# Patient Record
Sex: Female | Born: 1970 | Race: White | Hispanic: No | Marital: Married | Smoking: Never smoker
Health system: Southern US, Community
[De-identification: ages and names within clinical notes are randomized; demographics above are authoritative.]

## PROBLEM LIST (undated history)

## (undated) HISTORY — PX: TONSILLECTOMY: SUR1361

## (undated) HISTORY — PX: OTHER SURGICAL HISTORY: SHX169

---

## 1999-02-10 ENCOUNTER — Ambulatory Visit (HOSPITAL_COMMUNITY): Admission: RE | Admit: 1999-02-10 | Discharge: 1999-02-10 | Payer: Self-pay | Admitting: Urology

## 1999-02-10 ENCOUNTER — Encounter: Payer: Self-pay | Admitting: Urology

## 2004-07-27 ENCOUNTER — Ambulatory Visit (HOSPITAL_COMMUNITY): Admission: RE | Admit: 2004-07-27 | Discharge: 2004-07-27 | Payer: Self-pay | Admitting: Chiropractic Medicine

## 2006-04-18 IMAGING — CR DG CERVICAL SPINE COMPLETE 4+V
5 series · 5 of 5 positions shown · non-contrast
Comparison: None.

CLINICAL DATA: Neck and shoulder pain.  History of remote injury with more recent worsening pain. 
 CERVICAL SPINE ? FIVE VIEW:

[view not recorded (1 of 5)]
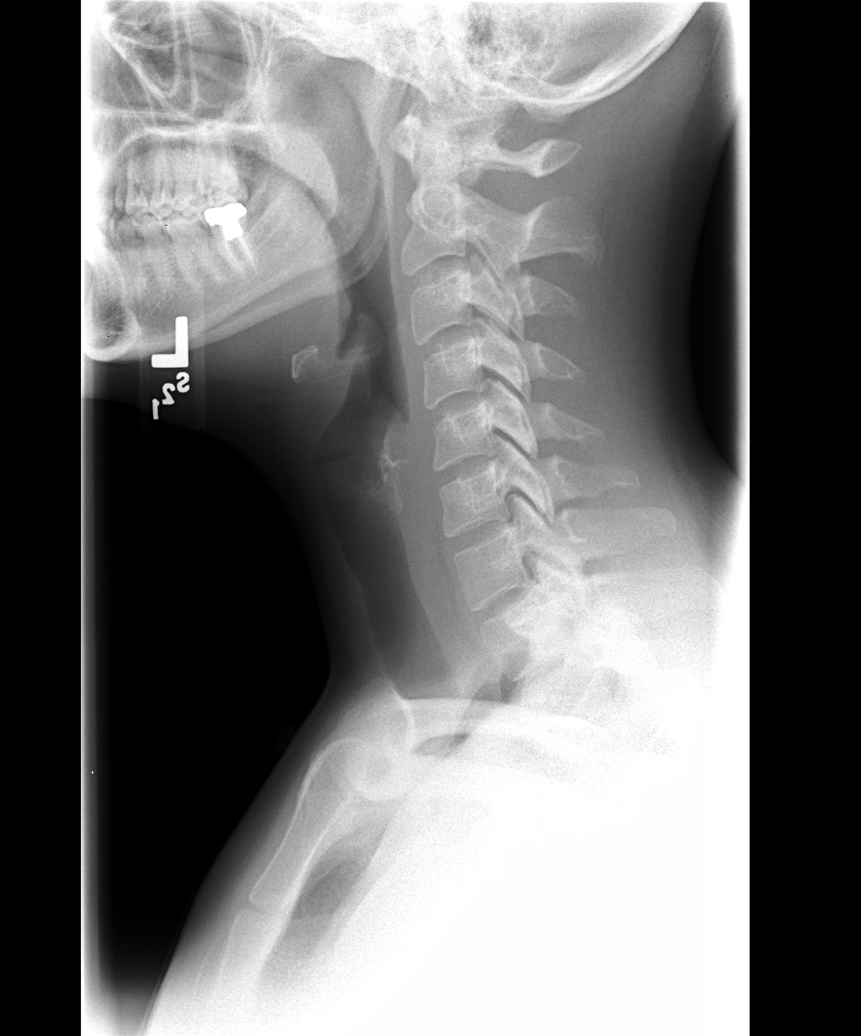

[view not recorded (2 of 5)]
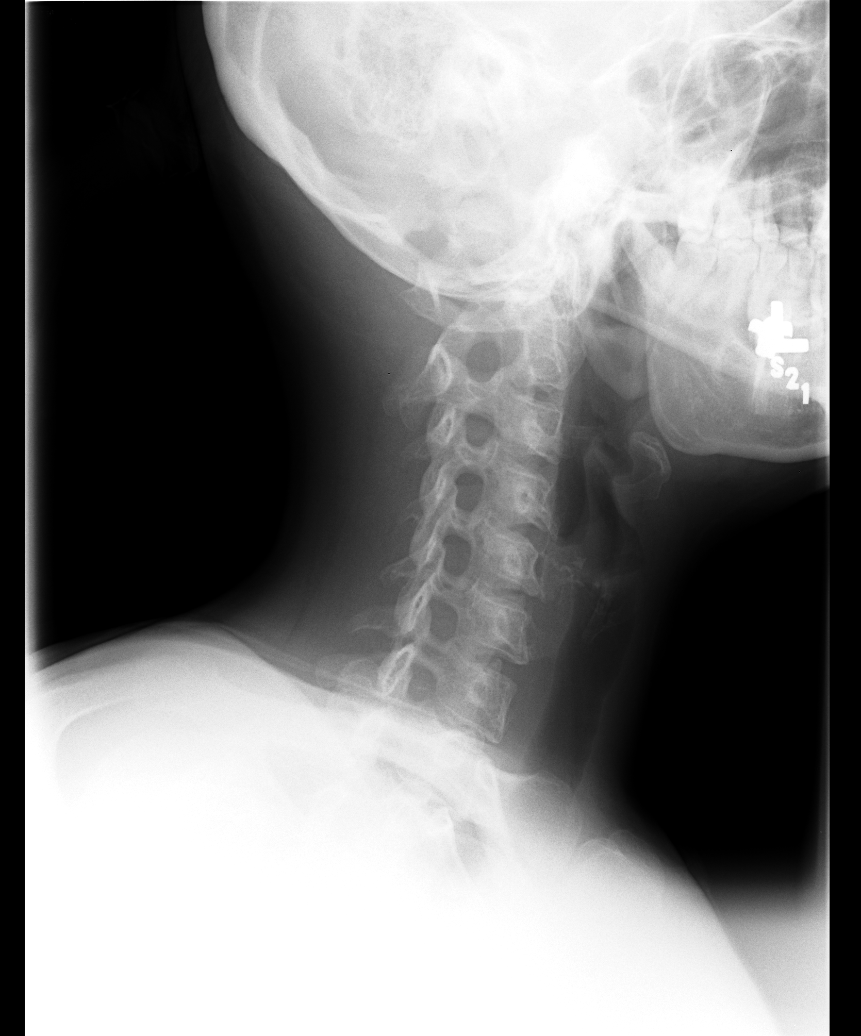

[view not recorded (3 of 5)]
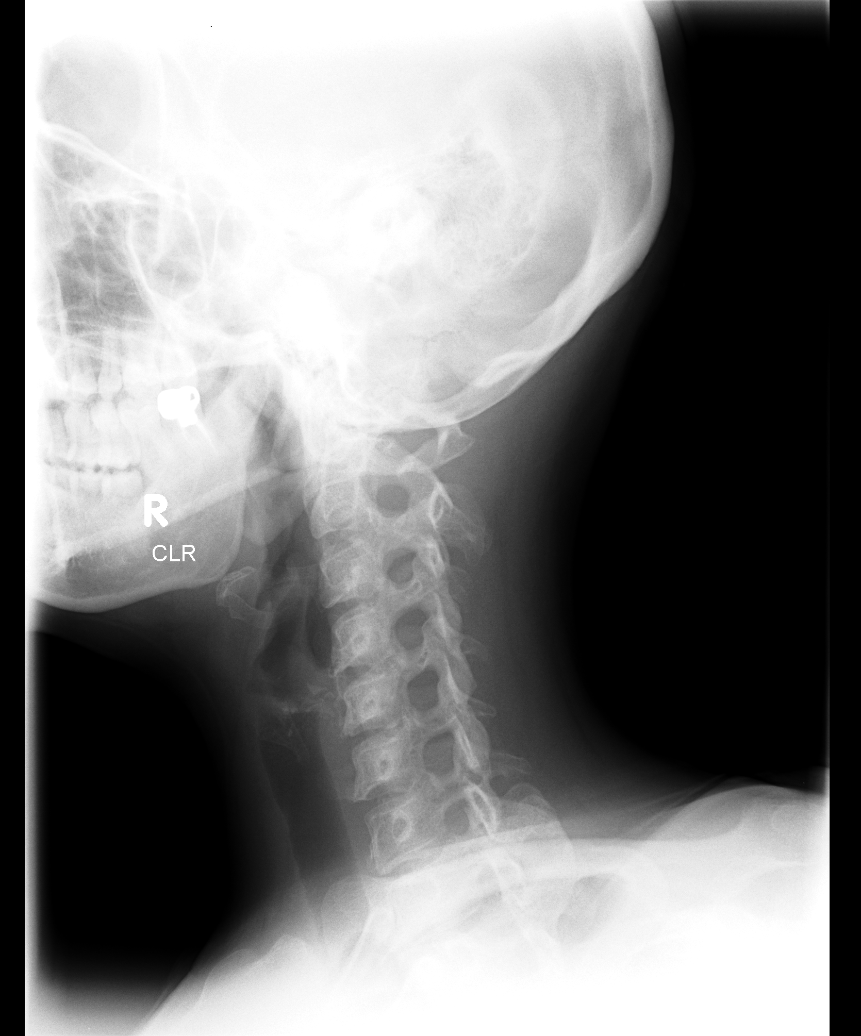

[view not recorded (4 of 5)]
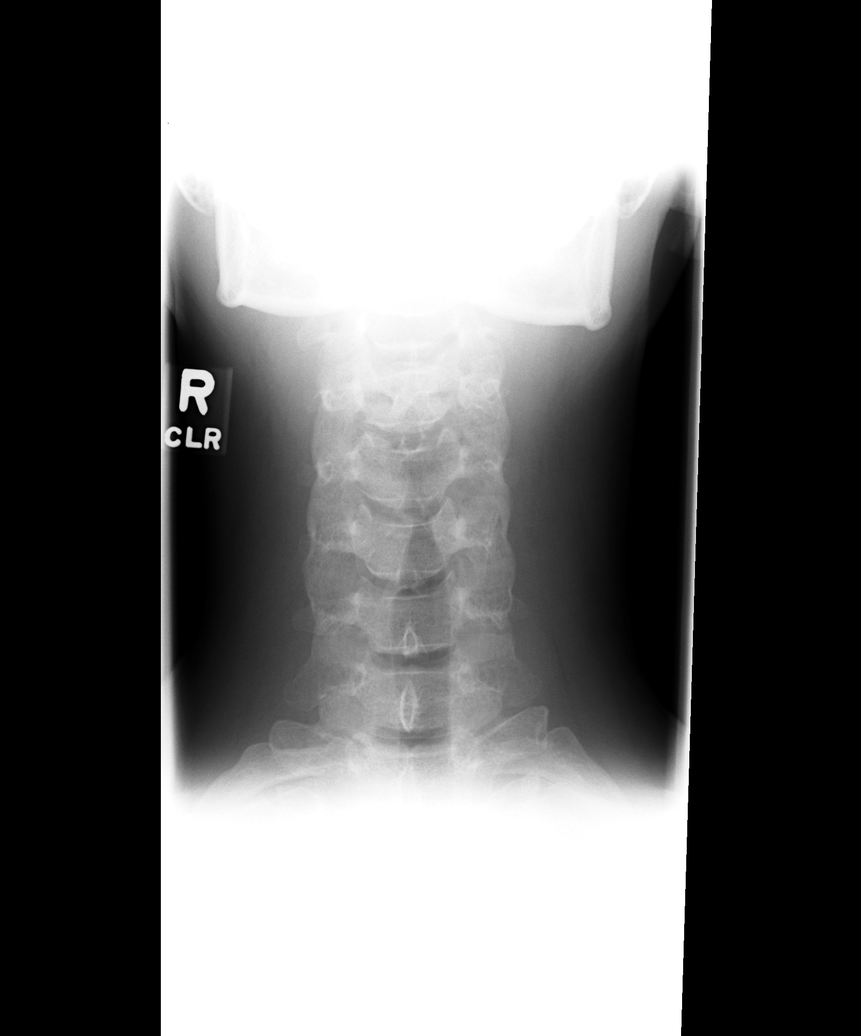

[view not recorded (5 of 5)]
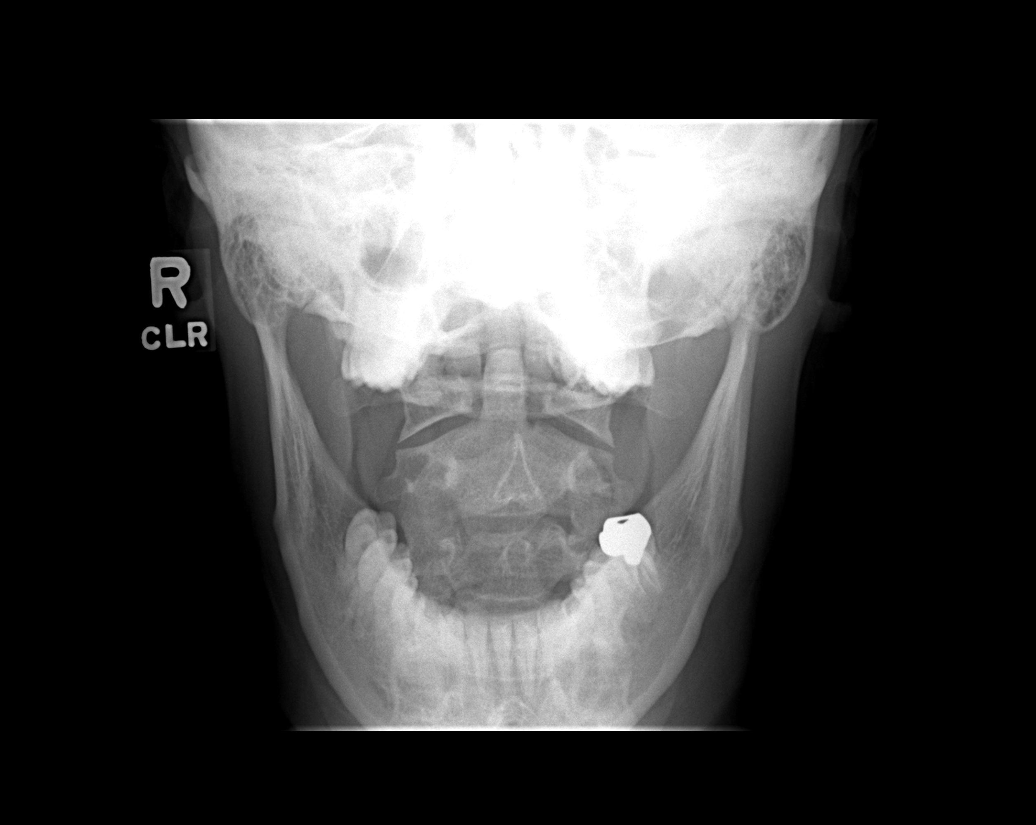

[5 of 5 positions shown; findings below may reference images not displayed]

FINDINGS: Five view exam of the cervical spine shows no acute fracture or subluxation.  There is straightening of the normal cervical lordosis. Intervertebral disc spaces are preserved.  The prevertebral soft tissues are unremarkable.
IMPRESSION: Mild straightening of the normal cervical lordosis without evidence for acute bony abnormality.

## 2014-08-15 ENCOUNTER — Emergency Department (INDEPENDENT_AMBULATORY_CARE_PROVIDER_SITE_OTHER)
Admission: EM | Admit: 2014-08-15 | Discharge: 2014-08-15 | Disposition: A | Discharge status: Home / Self Care | Payer: BLUE CROSS/BLUE SHIELD | Source: Home / Self Care | Attending: Family Medicine | Admitting: Family Medicine

## 2014-08-15 ENCOUNTER — Emergency Department
Admission: EM | Admit: 2014-08-15 | Discharge: 2014-08-15 | Discharge status: Absent without leave | Payer: BLUE CROSS/BLUE SHIELD | Source: Home / Self Care

## 2014-08-15 ENCOUNTER — Encounter: Payer: Self-pay | Admitting: Emergency Medicine

## 2014-08-15 DIAGNOSIS — J069 Acute upper respiratory infection, unspecified: Secondary | ICD-10-CM

## 2014-08-15 MED ORDER — AZITHROMYCIN 250 MG PO TABS: 250.0000 mg | ORAL_TABLET | Freq: Every day | ORAL | Status: DC

## 2014-08-15 NOTE — ED Provider Notes (Signed)
Kristy Jackson is a 44 y.o. female who presents to Urgent Care today for headache cough congestion body aches and fever. Symptoms present for 4 days. Husband was recently admitted for atypical pneumonia after a treatment failure with Omnicef. She denies any significant shortness of breath or wheezing. She has tried some over-the-counter medications which helped some.   History reviewed. No pertinent past medical history. Past Surgical History  Procedure Laterality Date  . Tonsillectomy    . Arm fracture Left    History  Substance Use Topics  . Smoking status: Never Smoker   . Smokeless tobacco: Not on file  . Alcohol Use: Yes   ROS as above Medications: No current facility-administered medications for this encounter.   Current Outpatient Prescriptions  Medication Sig Dispense Refill  . azithromycin (ZITHROMAX) 250 MG tablet Take 1 tablet (250 mg total) by mouth daily. Take first 2 tablets together, then 1 every day until finished. 6 tablet 0   Allergies  Allergen Reactions  . Shellfish Allergy      Exam:  BP 106/72 mmHg  Pulse 80  Temp(Src) 98 F (36.7 C) (Oral)  Resp 16  Ht 5\' 2"  (1.575 m)  Wt 136 lb (61.689 kg)  BMI 24.87 kg/m2  SpO2 98%  LMP 07/31/2014 Gen: Well NAD nontoxic appearing HEENT: EOMI,  MMM normal posterior pharynx and tympanic membranes Lungs: Normal work of breathing. Coarse breath sounds left lung field Heart: RRR no MRG Abd: NABS, Soft. Nondistended, Nontender Exts: Brisk capillary refill, warm and well perfused.   No results found for this or any previous visit (from the past 24 hour(s)). No results found.  Assessment and Plan: 44 y.o. female with upper respiratory infection possibly pneumonia. Based on husband's recent diagnosis of feels prudent to treat with azithromycin. Discussed x-ray. Patient declines. Watchful waiting follow-up as needed.  Discussed warning signs or symptoms. Please see discharge instructions. Patient expresses  understanding.     Rodolph BongEvan S Latisia Hilaire, MD 08/15/14 [phone]

## 2014-08-15 NOTE — Discharge Instructions (Signed)
Thank you for coming in today. Take azithromycin Return if not improving Call or go to the emergency room if you get worse, have trouble breathing, have chest pains, or palpitations.    Upper Respiratory Infection, Adult An upper respiratory infection (URI) is also sometimes known as the common cold. The upper respiratory tract includes the nose, sinuses, throat, trachea, and bronchi. Bronchi are the airways leading to the lungs. Most people improve within 1 week, but symptoms can last up to 2 weeks. A residual cough may last even longer.  CAUSES Many different viruses can infect the tissues lining the upper respiratory tract. The tissues become irritated and inflamed and often become very moist. Mucus production is also common. A cold is contagious. You can easily spread the virus to others by oral contact. This includes kissing, sharing a glass, coughing, or sneezing. Touching your mouth or nose and then touching a surface, which is then touched by another person, can also spread the virus. SYMPTOMS  Symptoms typically develop 1 to 3 days after you come in contact with a cold virus. Symptoms vary from person to person. They may include:  Runny nose.  Sneezing.  Nasal congestion.  Sinus irritation.  Sore throat.  Loss of voice (laryngitis).  Cough.  Fatigue.  Muscle aches.  Loss of appetite.  Headache.  Low-grade fever. DIAGNOSIS  You might diagnose your own cold based on familiar symptoms, since most people get a cold 2 to 3 times a year. Your caregiver can confirm this based on your exam. Most importantly, your caregiver can check that your symptoms are not due to another disease such as strep throat, sinusitis, pneumonia, asthma, or epiglottitis. Blood tests, throat tests, and X-rays are not necessary to diagnose a common cold, but they may sometimes be helpful in excluding other more serious diseases. Your caregiver will decide if any further tests are required. RISKS AND  COMPLICATIONS  You may be at risk for a more severe case of the common cold if you smoke cigarettes, have chronic heart disease (such as heart failure) or lung disease (such as asthma), or if you have a weakened immune system. The very young and very old are also at risk for more serious infections. Bacterial sinusitis, middle ear infections, and bacterial pneumonia can complicate the common cold. The common cold can worsen asthma and chronic obstructive pulmonary disease (COPD). Sometimes, these complications can require emergency medical care and may be life-threatening. PREVENTION  The best way to protect against getting a cold is to practice good hygiene. Avoid oral or hand contact with people with cold symptoms. Wash your hands often if contact occurs. There is no clear evidence that vitamin C, vitamin E, echinacea, or exercise reduces the chance of developing a cold. However, it is always recommended to get plenty of rest and practice good nutrition. TREATMENT  Treatment is directed at relieving symptoms. There is no cure. Antibiotics are not effective, because the infection is caused by a virus, not by bacteria. Treatment may include:  Increased fluid intake. Sports drinks offer valuable electrolytes, sugars, and fluids.  Breathing heated mist or steam (vaporizer or shower).  Eating chicken soup or other clear broths, and maintaining good nutrition.  Getting plenty of rest.  Using gargles or lozenges for comfort.  Controlling fevers with ibuprofen or acetaminophen as directed by your caregiver.  Increasing usage of your inhaler if you have asthma. Zinc gel and zinc lozenges, taken in the first 24 hours of the common cold, can shorten  the duration and lessen the severity of symptoms. Pain medicines may help with fever, muscle aches, and throat pain. A variety of non-prescription medicines are available to treat congestion and runny nose. Your caregiver can make recommendations and may  suggest nasal or lung inhalers for other symptoms.  HOME CARE INSTRUCTIONS   Only take over-the-counter or prescription medicines for pain, discomfort, or fever as directed by your caregiver.  Use a warm mist humidifier or inhale steam from a shower to increase air moisture. This may keep secretions moist and make it easier to breathe.  Drink enough water and fluids to keep your urine clear or pale yellow.  Rest as needed.  Return to work when your temperature has returned to normal or as your caregiver advises. You may need to stay home longer to avoid infecting others. You can also use a face mask and careful hand washing to prevent spread of the virus. SEEK MEDICAL CARE IF:   After the first few days, you feel you are getting worse rather than better.  You need your caregiver's advice about medicines to control symptoms.  You develop chills, worsening shortness of breath, or brown or red sputum. These may be signs of pneumonia.  You develop yellow or brown nasal discharge or pain in the face, especially when you bend forward. These may be signs of sinusitis.  You develop a fever, swollen neck glands, pain with swallowing, or white areas in the back of your throat. These may be signs of strep throat. SEEK IMMEDIATE MEDICAL CARE IF:   You have a fever.  You develop severe or persistent headache, ear pain, sinus pain, or chest pain.  You develop wheezing, a prolonged cough, cough up blood, or have a change in your usual mucus (if you have chronic lung disease).  You develop sore muscles or a stiff neck. Document Released: 01/05/2001 Document Revised: 10/04/2011 Document Reviewed: 10/17/2013 [facility] Patient Information 2015 [place], [place]. This information is not intended to replace advice given to you by your health care provider. Make sure you discuss any questions you have with your health care provider.

## 2014-08-15 NOTE — ED Notes (Signed)
Patient reports onset of congestion, cough, headache, fatigue 4 days ago; husband currently hospitalized for respiratory infection.

## 2015-09-22 ENCOUNTER — Encounter: Payer: Self-pay | Admitting: *Deleted

## 2015-09-22 ENCOUNTER — Emergency Department (INDEPENDENT_AMBULATORY_CARE_PROVIDER_SITE_OTHER)
Admission: EM | Admit: 2015-09-22 | Discharge: 2015-09-22 | Disposition: A | Discharge status: Home / Self Care | Payer: BLUE CROSS/BLUE SHIELD | Source: Home / Self Care | Attending: Family Medicine | Admitting: Family Medicine

## 2015-09-22 DIAGNOSIS — J209 Acute bronchitis, unspecified: Secondary | ICD-10-CM | POA: Diagnosis not present

## 2015-09-22 MED ORDER — AZITHROMYCIN 250 MG PO TABS: ORAL_TABLET | ORAL | Status: DC

## 2015-09-22 MED ORDER — GUAIFENESIN-CODEINE 100-10 MG/5ML PO SOLN: ORAL | Status: DC

## 2015-09-22 NOTE — Discharge Instructions (Signed)
Take plain guaifenesin (  extended release tabs such as Mucinex) twice daily, with plenty of water, for cough and congestion.  Get adequate rest.   Try warm salt water gargles for sore throat.  Stop all antihistamines for now, and other non-prescription cough/cold preparations.  Follow-up with family doctor if not improving about one week.

## 2015-09-22 NOTE — ED Notes (Signed)
Pt c/o non-productive cough and HA x 9 days. Denies fever.

## 2015-09-22 NOTE — ED Provider Notes (Signed)
CSN: [id number]     Arrival date & time 09/22/15  1443 History   First MD Initiated Contact with Patient 09/22/15 1515     Chief Complaint  Patient presents with  . Cough      HPI Comments: Patient complains of 10 day history of non-productive cough without sore throat or significant other URI symptoms.  No fevers, chills, and sweats.  She occasionally coughs until she gags.                                                                                                                          The history is provided by the patient and the spouse.    History reviewed. No pertinent past medical history. Past Surgical History  Procedure Laterality Date  . Tonsillectomy    . Arm fracture Left    Family History  Problem Relation Age of Onset  . Hypertension Father    Social History  Substance Use Topics  . Smoking status: Never Smoker   . Smokeless tobacco: None  . Alcohol Use: Yes   OB History    No data available     Review of Systems + sore throat + cough No pleuritic pain No wheezing No nasal congestion ? post-nasal drainage No sinus pain/pressure No itchy/red eyes No earache No hemoptysis No SOB No fever/chills No nausea No vomiting No abdominal pain No diarrhea No urinary symptoms No skin rash + fatigue No myalgias No headache Used OTC meds without relief  Allergies  Shellfish allergy  Home Medications   Prior to Admission medications   Medication Sig Start Date End Date Taking? Authorizing Provider  azithromycin (ZITHROMAX Z-PAK) 250 MG tablet Take 2 tabs today; then begin one tab once daily for 4 more days. 09/22/15   Lattie Haw, MD  guaiFENesin-codeine 100-10 MG/5ML syrup Take 10mL by mouth at bedtime as needed for cough 09/22/15   Lattie Haw, MD   Meds Ordered and Administered this Visit  Medications - No data to display  BP 91/65 mmHg  Pulse 60  Temp(Src) 98.4 F (36.9 C) (Oral)  Resp 16  Ht  (1.575 m)  Wt 148 lb (67.132  kg)  BMI 27.06 kg/m2  SpO2 100%  LMP 08/24/2015 No data found.   Physical Exam Nursing notes and Vital Signs reviewed. Appearance:  Patient appears stated age, and in no acute distress Eyes:  Pupils are equal, round, and reactive to light and accomodation.  Extraocular movement is intact.  Conjunctivae are not inflamed  Ears:  Canals normal.  Tympanic membranes normal.  Nose:  Mildly congested turbinates.  No sinus tenderness.    Pharynx:  Normal Neck:  Supple.  Tender enlarged posterior nodes are palpated bilaterally  Lungs:  Clear to auscultation.  Breath sounds are equal.  Moving air well. Heart:  Regular rate and rhythm without murmurs, rubs, or gallops.  Abdomen:  Nontender without masses or hepatosplenomegaly.  Bowel sounds are present.  No CVA  or flank tenderness.  Extremities:  No edema.  Skin:  No rash present.   ED Course  Procedures  None    MDM   1. Acute bronchitis, unspecified organism    Begin Z-pak for atypical coverage.  Rx for Robitussin AC for night time cough.  Take plain guaifenesin (  extended release tabs such as Mucinex) twice daily, with plenty of water, for cough and congestion.  Get adequate rest.   Try warm salt water gargles for sore throat.  Stop all antihistamines for now, and other non-prescription cough/cold preparations.  Follow-up with family doctor if not improving about one week.     Lattie Haw, MD 09/22/15 1540

## 2015-09-23 ENCOUNTER — Telehealth: Payer: Self-pay | Admitting: *Deleted

## 2015-09-30 ENCOUNTER — Telehealth: Payer: Self-pay

## 2015-10-01 ENCOUNTER — Emergency Department (INDEPENDENT_AMBULATORY_CARE_PROVIDER_SITE_OTHER): Payer: BLUE CROSS/BLUE SHIELD

## 2015-10-01 ENCOUNTER — Emergency Department (INDEPENDENT_AMBULATORY_CARE_PROVIDER_SITE_OTHER)
Admission: EM | Admit: 2015-10-01 | Discharge: 2015-10-01 | Disposition: A | Discharge status: Home / Self Care | Payer: BLUE CROSS/BLUE SHIELD | Source: Home / Self Care | Attending: Family Medicine | Admitting: Family Medicine

## 2015-10-01 DIAGNOSIS — R05 Cough: Secondary | ICD-10-CM

## 2015-10-01 DIAGNOSIS — R053 Chronic cough: Secondary | ICD-10-CM

## 2015-10-01 MED ORDER — GUAIFENESIN-CODEINE 100-10 MG/5ML PO SOLN: ORAL | Status: AC

## 2015-10-01 MED ORDER — BECLOMETHASONE DIPROPIONATE 40 MCG/ACT IN AERS: 2.0000 | INHALATION_SPRAY | Freq: Two times a day (BID) | RESPIRATORY_TRACT | Status: AC

## 2015-10-01 MED ORDER — CLARITHROMYCIN 500 MG PO TABS: 500.0000 mg | ORAL_TABLET | Freq: Two times a day (BID) | ORAL | Status: AC

## 2015-10-01 NOTE — ED Provider Notes (Signed)
CSN: [id number]     Arrival date & time 10/01/15  1637 History   First MD Initiated Contact with Patient 10/01/15 1713     Chief Complaint  Patient presents with  . Cough      HPI Comments: See previous note. Patient returns for follow-up of her persistent cough which has been present for about 3 weeks.  She has improved only slightly after a Z-pak and prednisone burst.  Her cough is worse at night.  No pleuritic pain.  No fevers, chills, and sweats.  No GI symptoms   The history is provided by the patient and a parent.    History reviewed. No pertinent past medical history. Past Surgical History  Procedure Laterality Date  . Tonsillectomy    . Arm fracture Left    Family History  Problem Relation Age of Onset  . Hypertension Father    Social History  Substance Use Topics  . Smoking status: Never Smoker   . Smokeless tobacco: None  . Alcohol Use: Yes   OB History    No data available     Review of Systems No sore throat + cough No pleuritic pain No wheezing No nasal congestion No post-nasal drainage No sinus pain/pressure No itchy/red eyes No earache No hemoptysis No SOB No fever/chills No nausea No vomiting No abdominal pain No diarrhea No urinary symptoms No skin rash No fatigue No myalgias No headache    Allergies  Shellfish allergy  Home Medications   Prior to Admission medications   Medication Sig Start Date End Date Taking? Authorizing Provider  beclomethasone (QVAR) 40 MCG/ACT inhaler Inhale 2 puffs into the lungs 2 (two) times daily. 10/01/15   Lattie Haw, MD  clarithromycin (BIAXIN) 500 MG tablet Take 1 tablet (500 mg total) by mouth 2 (two) times daily. 10/01/15   Lattie Haw, MD  guaiFENesin-codeine 100-10 MG/5ML syrup Take 10mL by mouth at bedtime as needed for cough 10/01/15   Lattie Haw, MD   Meds Ordered and Administered this Visit  Medications - No data to display  BP 105/70 mmHg  Pulse 65  Temp(Src) 98 F (36.7 C) (Oral)   Ht  (1.575 m)  Wt 146 lb 4 oz (66.339 kg)  BMI 26.74 kg/m2  SpO2 100%  LMP 08/24/2015 No data found.   Physical Exam Nursing notes and Vital Signs reviewed. Appearance:  Patient appears stated age, and in no acute distress Eyes:  Pupils are equal, round, and reactive to light and accomodation.  Extraocular movement is intact.  Conjunctivae are not inflamed  Ears:  Canals normal.  Tympanic membranes normal.  Nose:  Mildly congested turbinates.  No sinus tenderness.   Pharynx:  Normal Neck:  Supple.   Posterior nodes remain mildly tender.  Lungs:  Clear to auscultation.  Breath sounds are equal.  Moving air well. Heart:  Regular rate and rhythm without murmurs, rubs, or gallops.  Abdomen:  Nontender without masses or hepatosplenomegaly.  Bowel sounds are present.  No CVA or flank tenderness.  Extremities:  No edema.  Skin:  No rash present.   ED Course  Procedures none    Labs Reviewed  BORDETELLA PERTUSSIS ANTIBODY    Imaging Review Dg Chest 2 View  10/01/2015  CLINICAL DATA:  Dry cough for for 2.5 weeks, shortness of breath from coughing at times EXAM: CHEST  2 VIEW COMPARISON:  None FINDINGS: Normal heart size, mediastinal contours, and pulmonary vascularity. Lungs clear. No pneumothorax. Bones unremarkable. IMPRESSION: Normal  exam. Electronically Signed   By: Ulyses SouthwardMark  Boles M.D.   On: 10/01/2015 17:24     MDM   1. Persistent cough for 3 weeks or longer; ?pertussis    Doubt esophageal reflux. Check pertussis IgG (IgM would probably be negative by now).  Add Biaxin 500mg  BID for one week. Begin QVAR 2 puffs BID. If symptoms not improved 3 to 4 weeks, follow-up with pulmonologist.  May discontinue guaifenesin if not helpful.    Lattie HawStephen A Keola Heninger, MD 10/01/15 [phone]

## 2015-10-01 NOTE — ED Notes (Signed)
Pt and MD agree that will not do the Bordetella pertussis test

## 2015-10-01 NOTE — ED Notes (Signed)
Started February 18th with a cough, denies fever, aches.  Has a slight nasal drip.  Coughing mostly at night.  On Feb. 25th cough became worse, and describes them as coughing fits.  Here on the 27th, Dr. Cathren HarshBeese ordered z-pac.  Worse after 2 days, and she called and prednisone was called in.  She continues to have coughing fits, mostly at nights.

## 2015-10-01 NOTE — Discharge Instructions (Signed)
May discontinue guaifenesin if not helpful.

## 2016-10-26 ENCOUNTER — Ambulatory Visit (INDEPENDENT_AMBULATORY_CARE_PROVIDER_SITE_OTHER): Payer: Self-pay | Admitting: Physician Assistant

## 2017-06-22 IMAGING — CR DG CHEST 2V
2 series · 2 of 2 positions shown · non-contrast
Comparison: None

CLINICAL DATA: Dry cough for for 2.5 weeks, shortness of breath
from coughing at times

EXAM:
CHEST  2 VIEW

[chest pa]
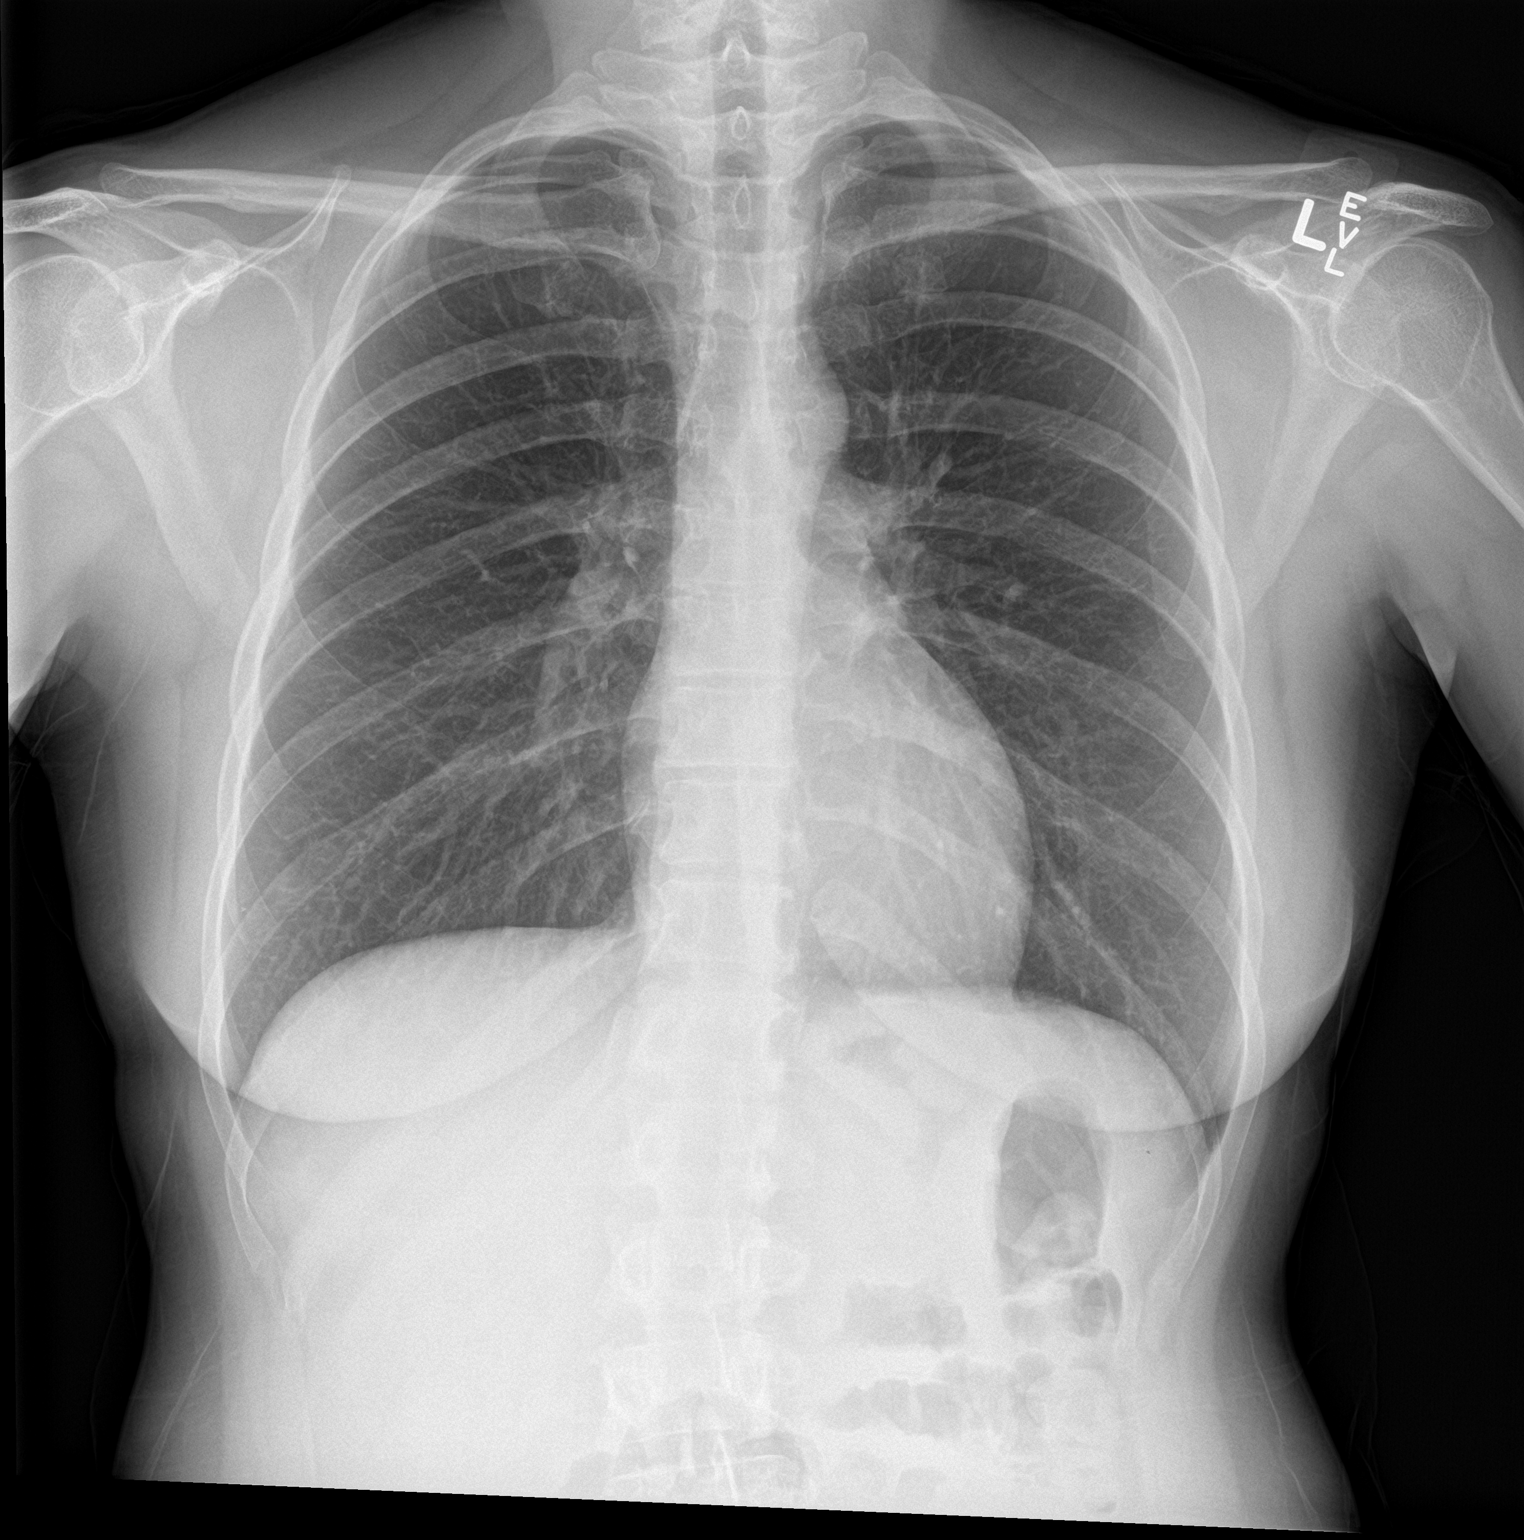

[chest lat]
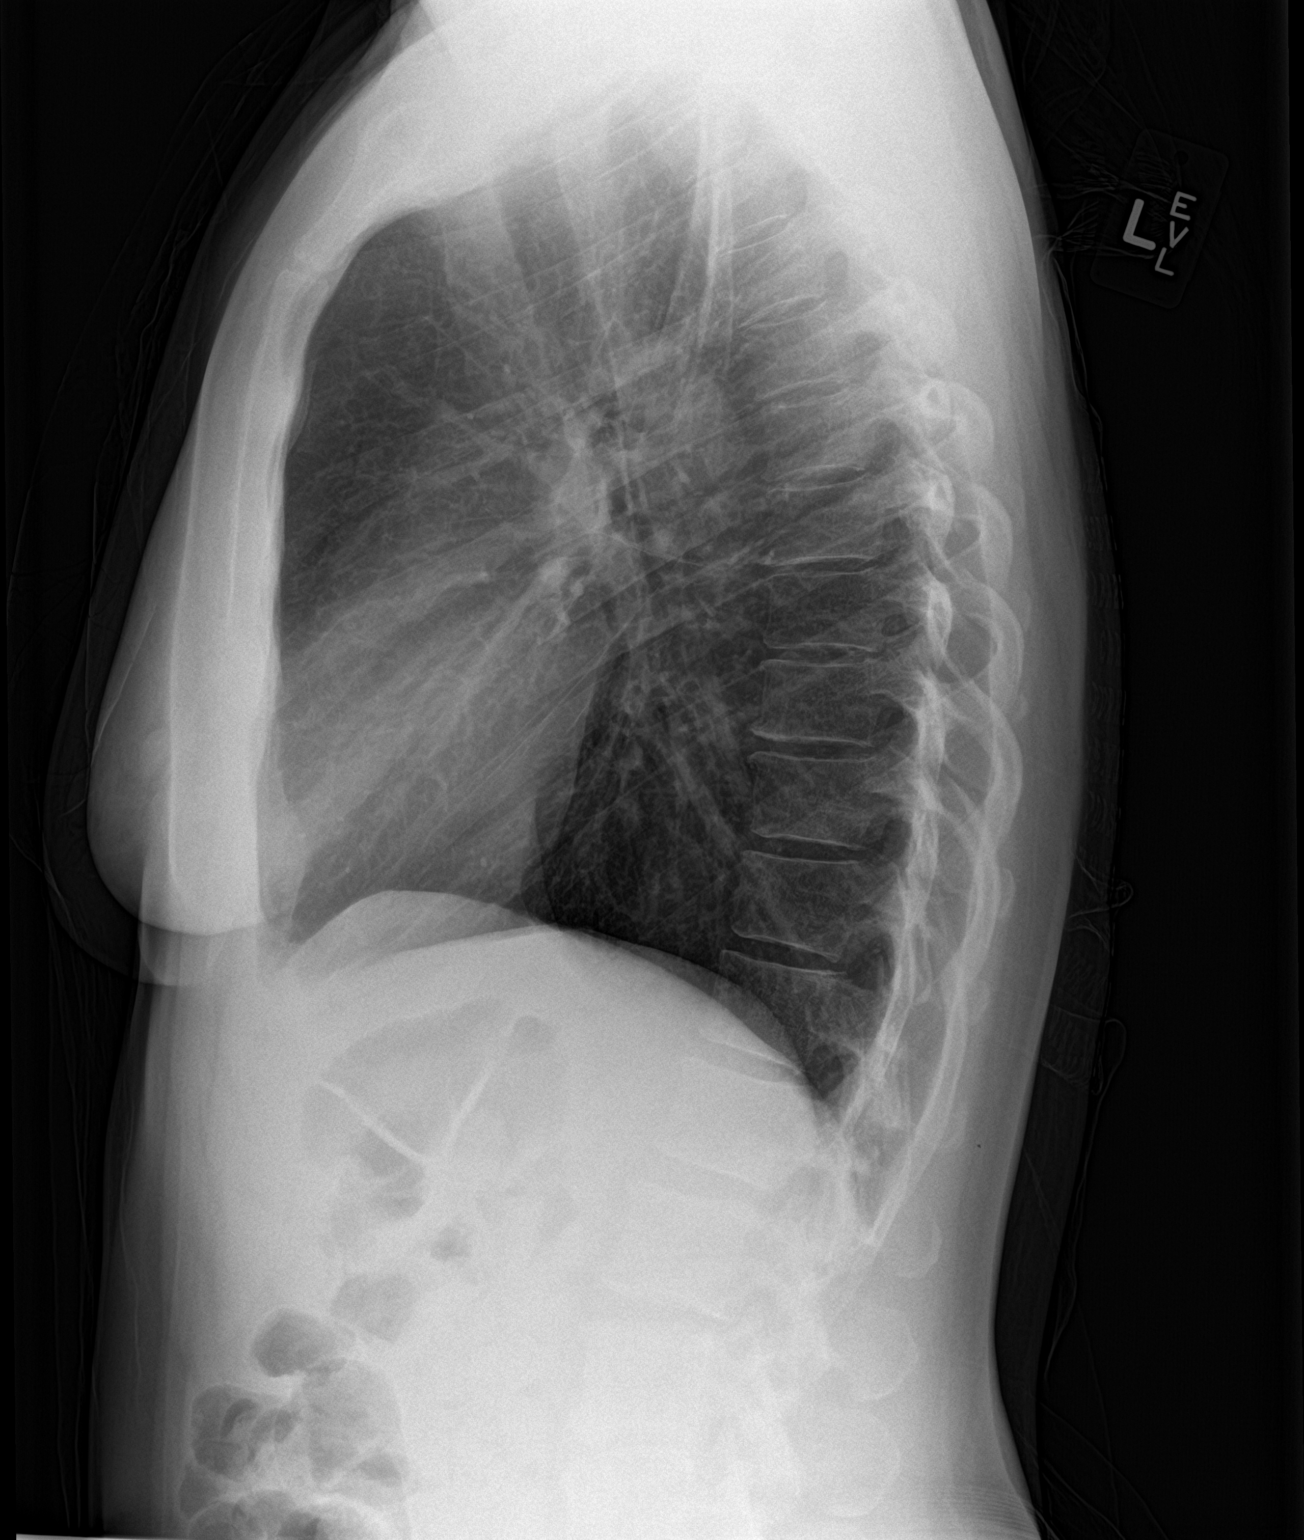

[2 of 2 positions shown; findings below may reference images not displayed]

FINDINGS: Normal heart size, mediastinal contours, and pulmonary vascularity.

Lungs clear.

No pneumothorax.

Bones unremarkable.
IMPRESSION: Normal exam.
